# Patient Record
Sex: Male | Born: 1965 | Race: White | Hispanic: No | Marital: Married | Smoking: Never smoker
Health system: Southern US, Community
[De-identification: ages and names within clinical notes are randomized; demographics above are authoritative.]

## PROBLEM LIST (undated history)

## (undated) ENCOUNTER — Ambulatory Visit: Admission: EM | Payer: BC Managed Care – PPO | Source: Home / Self Care

## (undated) DIAGNOSIS — I10 Essential (primary) hypertension: Secondary | ICD-10-CM

## (undated) HISTORY — PX: NO PAST SURGERIES: SHX2092

---

## 2007-01-25 ENCOUNTER — Emergency Department: Payer: Self-pay | Admitting: Emergency Medicine

## 2007-01-31 ENCOUNTER — Ambulatory Visit: Payer: Self-pay

## 2020-04-16 ENCOUNTER — Emergency Department: Payer: BC Managed Care – PPO

## 2020-04-16 ENCOUNTER — Other Ambulatory Visit: Payer: Self-pay

## 2020-04-16 ENCOUNTER — Inpatient Hospital Stay
Admission: EM | Admit: 2020-04-16 | Discharge: 2020-04-18 | DRG: 177 | Disposition: A | Discharge status: Home / Self Care | Payer: BC Managed Care – PPO | Attending: Internal Medicine | Admitting: Internal Medicine

## 2020-04-16 DIAGNOSIS — Z8249 Family history of ischemic heart disease and other diseases of the circulatory system: Secondary | ICD-10-CM

## 2020-04-16 DIAGNOSIS — U071 COVID-19: Secondary | ICD-10-CM | POA: Diagnosis present

## 2020-04-16 DIAGNOSIS — Z833 Family history of diabetes mellitus: Secondary | ICD-10-CM

## 2020-04-16 DIAGNOSIS — R7301 Impaired fasting glucose: Secondary | ICD-10-CM | POA: Diagnosis not present

## 2020-04-16 DIAGNOSIS — J1282 Pneumonia due to coronavirus disease 2019: Secondary | ICD-10-CM | POA: Diagnosis present

## 2020-04-16 DIAGNOSIS — Z79899 Other long term (current) drug therapy: Secondary | ICD-10-CM

## 2020-04-16 DIAGNOSIS — I10 Essential (primary) hypertension: Secondary | ICD-10-CM | POA: Diagnosis present

## 2020-04-16 DIAGNOSIS — J189 Pneumonia, unspecified organism: Secondary | ICD-10-CM

## 2020-04-16 HISTORY — DX: Essential (primary) hypertension: I10

## 2020-04-16 LAB — CBC WITH DIFFERENTIAL/PLATELET
Abs Immature Granulocytes: 0.35 10*3/uL — ABNORMAL HIGH (ref 0.00–0.07)
Basophils Absolute: 0 10*3/uL (ref 0.0–0.1)
Basophils Relative: 0 %
Eosinophils Absolute: 0 10*3/uL (ref 0.0–0.5)
Eosinophils Relative: 0 %
HCT: 42.4 % (ref 39.0–52.0)
Hemoglobin: 14.9 g/dL (ref 13.0–17.0)
Immature Granulocytes: 5 %
Lymphocytes Relative: 14 %
Lymphs Abs: 1 10*3/uL (ref 0.7–4.0)
MCH: 31.1 pg (ref 26.0–34.0)
MCHC: 35.1 g/dL (ref 30.0–36.0)
MCV: 88.5 fL (ref 80.0–100.0)
Monocytes Absolute: 0.4 10*3/uL (ref 0.1–1.0)
Monocytes Relative: 6 %
Neutro Abs: 5.1 10*3/uL (ref 1.7–7.7)
Neutrophils Relative %: 75 %
Platelets: 179 10*3/uL (ref 150–400)
RBC: 4.79 MIL/uL (ref 4.22–5.81)
RDW: 12.8 % (ref 11.5–15.5)
WBC: 6.8 10*3/uL (ref 4.0–10.5)
nRBC: 0 % (ref 0.0–0.2)

## 2020-04-16 LAB — BASIC METABOLIC PANEL
Anion gap: 12 (ref 5–15)
BUN: 14 mg/dL (ref 6–20)
CO2: 28 mmol/L (ref 22–32)
Calcium: 8.7 mg/dL — ABNORMAL LOW (ref 8.9–10.3)
Chloride: 98 mmol/L (ref 98–111)
Creatinine, Ser: 0.97 mg/dL (ref 0.61–1.24)
GFR calc Af Amer: >60 mL/min (ref >60)
GFR calc non Af Amer: >60 mL/min (ref >60)
Glucose, Bld: 124 mg/dL — ABNORMAL HIGH (ref 70–99)
Potassium: 3.8 mmol/L (ref 3.5–5.1)
Sodium: 138 mmol/L (ref 135–145)

## 2020-04-16 LAB — HIV ANTIBODY (ROUTINE TESTING W REFLEX): HIV Screen 4th Generation wRfx: NONREACTIVE

## 2020-04-16 LAB — FERRITIN: Ferritin: 1697 ng/mL — ABNORMAL HIGH (ref 24–336)

## 2020-04-16 LAB — C-REACTIVE PROTEIN: CRP: 6 mg/dL — ABNORMAL HIGH (ref <1.0)

## 2020-04-16 LAB — HEMOGLOBIN A1C
Hgb A1c MFr Bld: 5.9 % — ABNORMAL HIGH (ref 4.8–5.6)
Mean Plasma Glucose: 122.63 mg/dL

## 2020-04-16 LAB — ABO/RH: ABO/RH(D): A POS

## 2020-04-16 LAB — PROCALCITONIN: Procalcitonin: <0.1 ng/mL

## 2020-04-16 MED ORDER — SODIUM CHLORIDE 0.9 % IV SOLN
100.0000 mg | Freq: Every day | INTRAVENOUS | Status: DC
  Administered 2020-04-17 – 2020-04-18 (×2): 100 mg via INTRAVENOUS
  Filled 2020-04-16 (×2): qty 20

## 2020-04-16 MED ORDER — AMLODIPINE BESYLATE 5 MG PO TABS
5.0000 mg | ORAL_TABLET | Freq: Every day | ORAL | Status: DC
  Administered 2020-04-17 – 2020-04-18 (×2): 5 mg via ORAL
  Filled 2020-04-16 (×2): qty 1

## 2020-04-16 MED ORDER — FLUTICASONE PROPIONATE 50 MCG/ACT NA SUSP
2.0000 | Freq: Every day | NASAL | Status: DC
  Administered 2020-04-17 – 2020-04-18 (×2): 2 via NASAL
  Filled 2020-04-16: qty 16

## 2020-04-16 MED ORDER — ONDANSETRON HCL 4 MG/2ML IJ SOLN
4.0000 mg | Freq: Four times a day (QID) | INTRAMUSCULAR | Status: DC | PRN
  Filled 2020-04-16: qty 2

## 2020-04-16 MED ORDER — ZINC SULFATE 220 (50 ZN) MG PO CAPS
220.0000 mg | ORAL_CAPSULE | Freq: Every day | ORAL | Status: DC
  Administered 2020-04-17 – 2020-04-18 (×2): 220 mg via ORAL
  Filled 2020-04-16 (×3): qty 1

## 2020-04-16 MED ORDER — OXYCODONE HCL 5 MG PO TABS
5.0000 mg | ORAL_TABLET | ORAL | Status: DC | PRN
  Administered 2020-04-17: 5 mg via ORAL
  Filled 2020-04-16: qty 1

## 2020-04-16 MED ORDER — ASCORBIC ACID 500 MG PO TABS
500.0000 mg | ORAL_TABLET | Freq: Two times a day (BID) | ORAL | Status: DC
  Administered 2020-04-16 – 2020-04-18 (×4): 500 mg via ORAL
  Filled 2020-04-16 (×3): qty 1

## 2020-04-16 MED ORDER — DEXTROMETHORPHAN POLISTIREX ER 30 MG/5ML PO SUER
30.0000 mg | Freq: Every evening | ORAL | Status: DC | PRN
  Administered 2020-04-17: 30 mg via ORAL
  Filled 2020-04-16 (×2): qty 5

## 2020-04-16 MED ORDER — ENOXAPARIN SODIUM 40 MG/0.4ML ~~LOC~~ SOLN
40.0000 mg | SUBCUTANEOUS | Status: DC
  Administered 2020-04-16 – 2020-04-18 (×3): 40 mg via SUBCUTANEOUS
  Filled 2020-04-16 (×2): qty 0.4

## 2020-04-16 MED ORDER — ACETAMINOPHEN 325 MG PO TABS
650.0000 mg | ORAL_TABLET | Freq: Four times a day (QID) | ORAL | Status: DC | PRN
  Administered 2020-04-16 – 2020-04-17 (×2): 650 mg via ORAL
  Filled 2020-04-16 (×2): qty 2

## 2020-04-16 MED ORDER — KETOROLAC TROMETHAMINE 15 MG/ML IJ SOLN
15.0000 mg | Freq: Once | INTRAMUSCULAR | Status: DC
  Filled 2020-04-16: qty 1

## 2020-04-16 MED ORDER — SODIUM CHLORIDE 0.9 % IV SOLN
200.0000 mg | Freq: Once | INTRAVENOUS | Status: AC
  Administered 2020-04-16: 200 mg via INTRAVENOUS
  Filled 2020-04-16: qty 40

## 2020-04-16 MED ORDER — DEXAMETHASONE SODIUM PHOSPHATE 10 MG/ML IJ SOLN
6.0000 mg | INTRAMUSCULAR | Status: DC
  Administered 2020-04-16 – 2020-04-17 (×2): 6 mg via INTRAVENOUS
  Filled 2020-04-16 (×2): qty 1

## 2020-04-16 NOTE — ED Triage Notes (Signed)
Pt states he tested positive for covid last Monday and is not feeling any better, cough bodyaches, congestion, SOB. Pt is in NAD, states he has been taking OTC meds and prednisone his PCP Rx.

## 2020-04-16 NOTE — ED Provider Notes (Signed)
[facility] Emergency Department Provider Note   ____________________________________________   First MD Initiated Contact with Patient 04/16/20 1259     (approximate)  I have reviewed the triage vital signs and the nursing notes.   HISTORY  Chief Complaint Covid + and Shortness of Breath   HPI Levi Hernandez is a 54 y.o. male who presents to the ER with worsening COVID symptoms and shortness of breath. Patient was seen at [facility] 8 days ago, and tested positive following 2 days of symptoms. Since that time, he has continued to run fevers that he is treating with tylenol. He had acute worsening of symptoms last night when his cough turned more productive and is a green/grey color in appearance. He states it has become more difficult to breathe in the last 24 hrs than it was prior. His breathing is more difficult with activity including standing or walking. Denies chest pain, denies hemoptysis. Endorses mild intermittent abdominal pain but feels this may be due to not eating this morning.          Past Medical History:  Diagnosis Date  . Hypertension     Patient Active Problem List   Diagnosis Date Noted  . Pneumonia due to COVID-19 virus 04/16/2020    History reviewed. No pertinent surgical history.  Prior to Admission medications   Medication Sig Start Date End Date Taking? Authorizing Provider  amLODipine (NORVASC) 5 MG tablet Take 5 mg by mouth daily.   Yes [provider]  ibuprofen (ADVIL) 400 MG tablet Take 400 mg by mouth every 6 (six) hours as needed.   Yes [provider]  losartan (COZAAR) 100 MG tablet Take 100 mg by mouth daily.   Yes [provider]  predniSONE (DELTASONE) 10 MG tablet Take 10 mg by mouth daily. 04/08/20  Yes [provider]    Allergies Patient has no known allergies.  No family history on file.  Social History Social History   Tobacco Use  . Smoking status:  Never Smoker  . Smokeless tobacco: Never Used  Substance Use Topics  . Alcohol use: Yes  . Drug use: Not Currently    Review of Systems Constitutional: No fever/chills Eyes: No visual changes. ENT: + cough, + Congestion Cardiovascular: Denies chest pain. Respiratory: + shortness of breath Gastrointestinal: + abdominal pain.  No nausea, no vomiting.  No diarrhea.  No constipation. Genitourinary: Negative for dysuria. Musculoskeletal: Negative for back pain. Skin: Negative for rash. Neurological: Negative for headaches, focal weakness or numbness.  ____________________________________________   PHYSICAL EXAM:  VITAL SIGNS: ED Triage Vitals [04/16/20 1248]  Enc Vitals Group     BP (!) 134/92     Pulse Rate 77     Resp 17     Temp 98.7 F (37.1 C)     Temp Source Oral     SpO2 95 %     Weight 185 lb (83.9 kg)     Height 5\' 8"  (1.727 m)     Head Circumference      Peak Flow      Pain Score 8     Pain Loc      Pain Edu?      Excl. in GC?    Constitutional: Alert and oriented. Ill appearing, not in acute distress. Eyes: Conjunctivae are normal. PERRL. EOMI. Head: Atraumatic. Nose: No congestion/rhinnorhea. Mouth/Throat: Mucous membranes are moist.  Oropharynx erythematous. Neck: No stridor.  Cardiovascular: Normal rate, regular rhythm. Grossly normal  heart sounds.  Good peripheral circulation. Respiratory: Increased effort to breath. Crackles in bilateral lung bases, left worse than right. Gastrointestinal: Soft and nontender. No distention. No abdominal bruits. No CVA tenderness. Musculoskeletal: No lower extremity tenderness nor edema.  No joint effusions. Neurologic:  Normal speech and language. No gross focal neurologic deficits are appreciated. No gait instability. Skin:  Skin is warm, dry and intact. No rash noted. Psychiatric: Mood and affect are normal. Speech and behavior are normal.  ____________________________________________   LABS (all labs ordered  are listed, but only abnormal results are displayed)  Labs Reviewed  CBC WITH DIFFERENTIAL/PLATELET - Abnormal; Notable for the following components:      Result Value   Abs Immature Granulocytes 0.35 (*)    All other components within normal limits  BASIC METABOLIC PANEL - Abnormal; Notable for the following components:   Glucose, Bld 124 (*)    Calcium 8.7 (*)    All other components within normal limits   ____________________________________________  EKG   ____________________________________________  RADIOLOGY  ED MD interpretation:    Official radiology report(s): DG Chest 2 View  Result Date: 04/16/2020 CLINICAL DATA:  COVID positive. EXAM: CHEST - 2 VIEW COMPARISON:  None. FINDINGS: Hypoinflated lungs. Bilateral mid to lower lung patchy opacities. No pneumothorax or pleural effusion. Cardiomediastinal silhouette within normal limits. No acute osseous abnormality. IMPRESSION: Multifocal pulmonary opacities concerning for pneumonia. Electronically Signed   By: Stana Bunting M.D.   On: 04/16/2020 13:56    ____________________________________________   PROCEDURES  Procedure(s) performed (including Critical Care):  Procedures   ____________________________________________   INITIAL IMPRESSION / ASSESSMENT AND PLAN / ED COURSE  As part of my medical decision making, I reviewed the following data within the electronic MEDICAL RECORD NUMBER Notes from prior ED visits      Levi Hernandez is a 54 yo male who presents today for worsening with COVID infection. Symptomatic x 10 days, tested positive 8 days ago. Course was consistent without progress until last 24 hours, when he seems to have worsened. O2 in triage 95%, which it was noted to be at initial eval by [facility] clinic. Vitals otherwise stable. Exam with bilateral crackles in lung bases, concerning for pneumonia. Given worsening, will obtain Chest XR, CBC, BMP.   Chest XR revealed multifocal opacities in the  bilateral lungs, concerning for pneumonia. CBC with increased immature granulocytes. BMP grossly normal . Will obtain consult of hospitalist for consideration for admission.   After discussion with Dr. Dorothea Glassman and consult with hospitalist team, patient will be admitted.  Clinical Course as of Apr 16 1545  Tue Apr 16, 2020  1409 MCHC: 35.1 [CR]    Clinical Course User Index [CR] Lucy Chris, PA     ____________________________________________   FINAL CLINICAL IMPRESSION(S) / ED DIAGNOSES  Final diagnoses:  COVID-19  Community acquired pneumonia, unspecified laterality     ED Discharge Orders    None       Note:  This document was prepared using Dragon voice recognition software and may include unintentional dictation errors.    Lucy Chris, PA 04/16/20 1547    Arnaldo Natal, MD 04/16/20 [id number]

## 2020-04-16 NOTE — ED Notes (Signed)
See triage note  Presents with body aches,cough and congestion  Was tested positive for COVID about 1 week ago

## 2020-04-16 NOTE — H&P (Addendum)
[facility]-  at [facility]   PATIENT NAME: Levi Hernandez    MR#:  [id number]  DATE OF BIRTH:  [birth year]  DATE OF ADMISSION:  04/16/2020  PRIMARY CARE PHYSICIAN: Dr Jerl Mina  REQUESTING/REFERRING PHYSICIAN: Dr Dorothea Glassman  CHIEF COMPLAINT:   Chief Complaint  Patient presents with  . Covid +  . Shortness of Breath    HISTORY OF PRESENT ILLNESS:  Levi Hernandez  is a 54 y.o. male recently diagnosed with Covid +8 days ago.  Over the last few days getting worse with regards to coughing and shortness of breath.  Bringing up dark phlegm.  He has been having fevers on and off and some chills.  Some runny nose.  Exaggerated taste.  Some joint pains.  He had worsening shortness of breath.  Does have poor appetite.  His mouth is dry.  In the ER he was found to have pneumonia on the chest x-ray.  Hospitalist services contacted for admission for Covid 19 pneumonia.  The patient has not had the COVID-19 vaccination.  PAST MEDICAL HISTORY:   Past Medical History:  Diagnosis Date  . Hypertension     PAST SURGICAL HISTORY:   Past Surgical History:  Procedure Laterality Date  . NO PAST SURGERIES      SOCIAL HISTORY:   Social History   Tobacco Use  . Smoking status: Never Smoker  . Smokeless tobacco: Never Used  Substance Use Topics  . Alcohol use: Yes    Comment: few drink on the weekneds    FAMILY HISTORY:   Family History  Problem Relation Age of Onset  . Diabetes Mother   . Hypertension Mother   . Diabetes Father   . Hypertension Father     DRUG ALLERGIES:  No Known Allergies  REVIEW OF SYSTEMS:  CONSTITUTIONAL: Positive for fever, and chills.  EYES: No blurred or double vision.  EARS, NOSE, AND THROAT: Positive for runny nose.  Exaggerated taste. RESPIRATORY: Positive for cough and shortness of breath. CARDIOVASCULAR: No chest pain.  GASTROINTESTINAL: Some nausea but no vomiting.  Some abdominal pain.  Some diarrhea. GENITOURINARY:  No dysuria.  ENDOCRINE: No polyuria HEMATOLOGY: No anemia. SKIN: No rash or lesion. MUSCULOSKELETAL: Positive for joint pain.   NEUROLOGIC: No tingling, numbness.  PSYCHIATRY: No anxiety or depression.   MEDICATIONS AT HOME:   Prior to Admission medications   Medication Sig Start Date End Date Taking? Authorizing Provider  amLODipine (NORVASC) 5 MG tablet Take 5 mg by mouth daily.   Yes [provider]  fluticasone (FLONASE) 50 MCG/ACT nasal spray Place 2 sprays into both nostrils daily.   Yes [provider]  ibuprofen (ADVIL) 400 MG tablet Take 400 mg by mouth every 6 (six) hours as needed.   Yes [provider]  losartan (COZAAR) 100 MG tablet Take 100 mg by mouth daily.   Yes [provider]  predniSONE (DELTASONE) 10 MG tablet Take 10 mg by mouth daily. 04/08/20  Yes [provider]   Medication reconciliation undergoing  VITAL SIGNS:  Blood pressure 130/88, pulse 60, temperature 98.7 F (37.1 C), temperature source Oral, resp. rate 20, height 5\' 8"  (1.727 m), weight 83.9 kg, SpO2 96 %.  PHYSICAL EXAMINATION:  GENERAL:  54 y.o.-year-old patient lying in the bed with no acute distress.  EYES: Pupils equal, round, reactive to light and accommodation. No scleral icterus. HEENT: Head atraumatic, normocephalic. Oropharynx and nasopharynx clear.  NECK:  Supple, no jugular venous distention. No thyroid enlargement, no  tenderness.  LUNGS: Normal breath sounds bilaterally, no wheezing, rales,rhonchi or crepitation. No use of accessory muscles of respiration.  CARDIOVASCULAR: S1, S2 normal. No murmurs, rubs, or gallops.  ABDOMEN: Soft, nontender, nondistended.  EXTREMITIES: No pedal edema.  NEUROLOGIC: Cranial nerves II through XII are intact. Muscle strength 5/5 in all extremities.  PSYCHIATRIC: The patient is alert and oriented x 3.  SKIN: No rash, lesion, or ulcer.   LABORATORY PANEL:   CBC Recent Labs  Lab 04/16/20 1350  WBC 6.8   HGB 14.9  HCT 42.4  PLT 179   ------------------------------------------------------------------------------------------------------------------  Chemistries  Recent Labs  Lab 04/16/20 1350  NA 138  K 3.8  CL 98  CO2 28  GLUCOSE 124*  BUN 14  CREATININE 0.97  CALCIUM 8.7*   ------------------------------------------------------------------------------------------------------------------   RADIOLOGY:  DG Chest 2 View  Result Date: 04/16/2020 CLINICAL DATA:  COVID positive. EXAM: CHEST - 2 VIEW COMPARISON:  None. FINDINGS: Hypoinflated lungs. Bilateral mid to lower lung patchy opacities. No pneumothorax or pleural effusion. Cardiomediastinal silhouette within normal limits. No acute osseous abnormality. IMPRESSION: Multifocal pulmonary opacities concerning for pneumonia. Electronically Signed   By: Stana Bunting M.D.   On: 04/16/2020 13:56    IMPRESSION AND PLAN:   1.  COVID-19 pneumonia.  Will start remdesivir and Decadron.  Send off inflammatory markers.  The patient is not hypoxic at this time.  If doing better in the next day or so can potentially do outpatient infusions to finish up the runs of severe course.  We will give vitamin C and zinc. 2.  Essential hypertension.  Continue Norvasc. 3.  Impaired fasting glucose check a hemoglobin A1c   All the records, laboratory and radiological data are reviewed and case discussed with ED provider. Management plans discussed with the patient, and he is in agreement.  Patient deferred me calling family at this time.  Patient requires inpatient status with COVID-19 pneumonia and starting the remdesivir 5-day course.  Start IV Decadron.  If feeling better in the next day or so may be able to finish up the course as outpatient.  CODE STATUS: Full code  TOTAL TIME TAKING CARE OF THIS PATIENT: 50 minutes.    Alford Highland M.D on 04/16/2020 at 4:03 PM  Between 7am to 6pm - Pager - [phone]  After 6pm call admission  pager [phone]  [facility]  CC: Primary care physician; Dr. Jerl Mina

## 2020-04-16 NOTE — Progress Notes (Signed)
Remdesivir - Pharmacy Brief Note   Patient tested positive for COVID approximately one week ago.  O:  CXR: Multifocal pulmonary opacities concerning for pneumonia. SpO2: 93% on room air   A/P:  Remdesivir 200 mg IVPB once followed by 100 mg IVPB daily x 4 days.   Laureen Ochs, PharmD 04/16/2020 3:53 PM

## 2020-04-16 NOTE — ED Notes (Signed)
O2 sats  93% at rest  Drops to 92-93% with ambulation

## 2020-04-17 LAB — COMPREHENSIVE METABOLIC PANEL
ALT: 199 U/L — ABNORMAL HIGH (ref 0–44)
AST: 224 U/L — ABNORMAL HIGH (ref 15–41)
Albumin: 3.7 g/dL (ref 3.5–5.0)
Alkaline Phosphatase: 45 U/L (ref 38–126)
Anion gap: 12 (ref 5–15)
BUN: 17 mg/dL (ref 6–20)
CO2: 26 mmol/L (ref 22–32)
Calcium: 8.7 mg/dL — ABNORMAL LOW (ref 8.9–10.3)
Chloride: 102 mmol/L (ref 98–111)
Creatinine, Ser: 0.76 mg/dL (ref 0.61–1.24)
GFR calc Af Amer: >60 mL/min (ref >60)
GFR calc non Af Amer: >60 mL/min (ref >60)
Glucose, Bld: 143 mg/dL — ABNORMAL HIGH (ref 70–99)
Potassium: 4.1 mmol/L (ref 3.5–5.1)
Sodium: 140 mmol/L (ref 135–145)
Total Bilirubin: 1.3 mg/dL — ABNORMAL HIGH (ref 0.3–1.2)
Total Protein: 7.2 g/dL (ref 6.5–8.1)

## 2020-04-17 LAB — CBC WITH DIFFERENTIAL/PLATELET
Abs Immature Granulocytes: 0.27 10*3/uL — ABNORMAL HIGH (ref 0.00–0.07)
Basophils Absolute: 0 10*3/uL (ref 0.0–0.1)
Basophils Relative: 1 %
Eosinophils Absolute: 0 10*3/uL (ref 0.0–0.5)
Eosinophils Relative: 0 %
HCT: 42 % (ref 39.0–52.0)
Hemoglobin: 15.1 g/dL (ref 13.0–17.0)
Immature Granulocytes: 7 %
Lymphocytes Relative: 27 %
Lymphs Abs: 1 10*3/uL (ref 0.7–4.0)
MCH: 31.3 pg (ref 26.0–34.0)
MCHC: 36 g/dL (ref 30.0–36.0)
MCV: 87.1 fL (ref 80.0–100.0)
Monocytes Absolute: 0.3 10*3/uL (ref 0.1–1.0)
Monocytes Relative: 8 %
Neutro Abs: 2.2 10*3/uL (ref 1.7–7.7)
Neutrophils Relative %: 57 %
Platelets: 199 10*3/uL (ref 150–400)
RBC: 4.82 MIL/uL (ref 4.22–5.81)
RDW: 12.6 % (ref 11.5–15.5)
Smear Review: NORMAL
WBC: 3.8 10*3/uL — ABNORMAL LOW (ref 4.0–10.5)
nRBC: 0 % (ref 0.0–0.2)

## 2020-04-17 LAB — C-REACTIVE PROTEIN: CRP: 5.3 mg/dL — ABNORMAL HIGH (ref <1.0)

## 2020-04-17 LAB — PHOSPHORUS: Phosphorus: 4.7 mg/dL — ABNORMAL HIGH (ref 2.5–4.6)

## 2020-04-17 LAB — MAGNESIUM: Magnesium: 2.2 mg/dL (ref 1.7–2.4)

## 2020-04-17 LAB — FERRITIN: Ferritin: 2205 ng/mL — ABNORMAL HIGH (ref 24–336)

## 2020-04-17 LAB — FIBRIN DERIVATIVES D-DIMER (ARMC ONLY): Fibrin derivatives D-dimer (ARMC): 1192.82 ng/mL (FEU) — ABNORMAL HIGH (ref 0.00–499.00)

## 2020-04-17 MED ORDER — ALBUTEROL SULFATE HFA 108 (90 BASE) MCG/ACT IN AERS
1.0000 | INHALATION_SPRAY | Freq: Four times a day (QID) | RESPIRATORY_TRACT | Status: DC
  Administered 2020-04-17 – 2020-04-18 (×4): 1 via RESPIRATORY_TRACT
  Filled 2020-04-17: qty 6.7

## 2020-04-17 MED ORDER — HYDROCOD POLST-CPM POLST ER 10-8 MG/5ML PO SUER: 5.0000 mL | Freq: Two times a day (BID) | ORAL | Status: DC | PRN

## 2020-04-17 MED ORDER — BIOTENE DRY MOUTH MT LIQD: 15.0000 mL | OROMUCOSAL | Status: DC | PRN

## 2020-04-17 MED ORDER — ADULT MULTIVITAMIN W/MINERALS CH
1.0000 | ORAL_TABLET | Freq: Every day | ORAL | Status: DC
  Administered 2020-04-18: 09:00:00 1 via ORAL
  Filled 2020-04-17: qty 1

## 2020-04-17 MED ORDER — ENSURE ENLIVE PO LIQD
237.0000 mL | Freq: Three times a day (TID) | ORAL | Status: DC
  Administered 2020-04-17 – 2020-04-18 (×3): 237 mL via ORAL

## 2020-04-17 NOTE — Progress Notes (Signed)
Initial Nutrition Assessment  DOCUMENTATION CODES:   Not applicable  INTERVENTION:   Ensure Enlive po TID, each supplement provides 350 kcal and 20 grams of protein  MVI daily   Liberalize diet   NUTRITION DIAGNOSIS:   Increased nutrient needs related to catabolic illness (COVID 19) as evidenced by increased estimated needs.  GOAL:   Patient will meet greater than or equal to 90% of their needs  MONITOR:   PO intake, Supplement acceptance, Labs, Weight trends, Skin, I & O's  REASON FOR ASSESSMENT:   Malnutrition Screening Tool    ASSESSMENT:   54 y/o male with h/o HTN who is admitted with COVID 19 PNA   Spoke with pt via phone. Pt reports decreased appetite and oral intake for 1 week pta r/t COVID 19. Pt reports that he has been eating some and that he feels hungry today. RD discussed with pt the importance of adequate nutrition needed to preserve lean muscle. Pt is willing to drink chocolate supplements. RD will add supplements and MVI to help pt meet his estimated needs. RD will also liberalize pt's diet. Per chart pt's UBW appears to be ~185-190lbs; pt appears weight stable at baseline.   Medications reviewed and include: vitamin C, dexamethasone, lovenox, zinc  Labs reviewed: K 4.1 wnl, P 4.7(H), Mg 2.2 wnl Wbc- 3.8(L) cbgs- 124, 143 x 24 hrs AIC- 5.9(L)- 8/10  NUTRITION - FOCUSED PHYSICAL EXAM: Unable to perform as pt with COVID 19  Diet Order:   Diet Order            Diet regular Room service appropriate? Yes; Fluid consistency: Thin  Diet effective now                EDUCATION NEEDS:   Education needs have been addressed  Skin:  Skin Assessment: Reviewed RN Assessment  Last BM:  8/9  Height:   Ht Readings from Last 1 Encounters:  04/16/20 5\' 8"  (1.727 m)    Weight:   Wt Readings from Last 1 Encounters:  04/16/20 83.9 kg    Ideal Body Weight:  70 kg  BMI:  Body mass index is 28.13 kg/m.  Estimated Nutritional Needs:   Kcal:   2200-2500kcal/day  Protein:  110-125g/day  Fluid:  >2.1L/day  06/16/20 MS, RD, LDN Please refer to [facility] for RD and/or RD on-call/weekend/after hours pager

## 2020-04-17 NOTE — Progress Notes (Addendum)
PROGRESS NOTE    Levi Hernandez  GHW:[id number] DOB: 1965-11-21 DOA: 04/16/2020 PCP: Patient, No Pcp Per   Brief Narrative: 54 year old recently diagnosed with Covid 19 8 days prior to admission.  Presents with worsening cough and shortness of breath.  Productive cough.  Reports on and off fevers and chills.  Dry mouth.  Evaluation in the ED chest x-ray was positive for pneumonia. She is admitted for COVID-19 pneumonia.   Assessment & Plan:   Active Problems:   Pneumonia due to COVID-19 virus   1-COVID-19 pneumonia: Patient was a started on Remdesivir and Decadron. Continue with vitamin C and zinc Albuterol inhaler.  Tussionex PRN order.   2-Hypertension: Continue with Norvasc  3-Impaired fasting glucose: Hb-A1c; 5.9 Need to follow diet    Nutrition Problem: Increased nutrient needs Etiology: catabolic illness (COVID 19)    Signs/Symptoms: estimated needs    Interventions: Ensure Enlive (each supplement provides 350kcal and 20 grams of protein), MVI, Liberalize Diet  Estimated body mass index is 28.13 kg/m as calculated from the following:   Height as of this encounter: 5\' 8"  (1.727 m).   Weight as of this encounter: 83.9 kg.   DVT prophylaxis: Lovenox Code Status: Full code Family Communication: Care discussed with patient Disposition Plan:  Status is: Inpatient  Remains inpatient appropriate because:Hemodynamically unstable   Dispo: The patient is from: Home              Anticipated d/c is to: Home              Anticipated d/c date is: 2 days              Patient currently is not medically stable to d/c.        Consultants:   None  Procedures:   None  Antimicrobials:    Subjective: He report shortness of breath especially when he has cough episodes.  He reports productive cough.  He feels his mouth is dry.  Will try Biotene mouthwash.  He was asking for his blood pressure medication.  Objective: Vitals:   04/17/20 0113 04/17/20  0753 04/17/20 1109 04/17/20 1549  BP: 117/69 120/73 130/86 (!) 113/91  Pulse: 60 64 62 78  Resp: 17 17 17 17   Temp: 98.3 F (36.8 C) 97.8 F (36.6 C) 98.5 F (36.9 C) 97.9 F (36.6 C)  TempSrc:  Oral Oral Oral  SpO2: 96% 95% 94% 91%  Weight:      Height:       No intake or output data in the 24 hours ending 04/17/20 1606 Filed Weights   04/16/20 1248  Weight: 83.9 kg    Examination:  General exam: Appears calm and comfortable  Respiratory system:  Respiratory effort normal.  Bilateral rhonchorous Cardiovascular system: S1 & S2 heard, RRR. No JVD, murmurs, rubs, gallops or clicks. No pedal edema. Gastrointestinal system: Abdomen is nondistended, soft and nontender. No organomegaly or masses felt. Normal bowel sounds heard. Central nervous system: Alert and oriented. No focal neurological deficits. Extremities: Symmetric 5 x 5 power.    Data Reviewed: I have personally reviewed following labs and imaging studies  CBC: Recent Labs  Lab 04/16/20 1350 04/17/20 0449  WBC 6.8 3.8*  NEUTROABS 5.1 2.2  HGB 14.9 15.1  HCT 42.4 42.0  MCV 88.5 87.1  PLT 179 199   Basic Metabolic Panel: Recent Labs  Lab 04/16/20 1350 04/17/20 0449  NA 138 140  K 3.8 4.1  CL 98 102  CO2 28 26  GLUCOSE 124* 143*  BUN 14 17  CREATININE 0.97 0.76  CALCIUM 8.7* 8.7*  MG  --  2.2  PHOS  --  4.7*   GFR: Estimated Creatinine Clearance: 111.4 mL/min (by C-G formula based on SCr of 0.76 mg/dL). Liver Function Tests: Recent Labs  Lab 04/17/20 0449  AST 224*  ALT 199*  ALKPHOS 45  BILITOT 1.3*  PROT 7.2  ALBUMIN 3.7   No results for input(s): LIPASE, AMYLASE in the last 168 hours. No results for input(s): AMMONIA in the last 168 hours. Coagulation Profile: No results for input(s): INR, PROTIME in the last 168 hours. Cardiac Enzymes: No results for input(s): CKTOTAL, CKMB, CKMBINDEX, TROPONINI in the last 168 hours. BNP (last 3 results) No results for input(s): PROBNP in the  last 8760 hours. HbA1C: Recent Labs    04/16/20 1614  HGBA1C 5.9*   CBG: No results for input(s): GLUCAP in the last 168 hours. Lipid Profile: No results for input(s): CHOL, HDL, LDLCALC, TRIG, CHOLHDL, LDLDIRECT in the last 72 hours. Thyroid Function Tests: No results for input(s): TSH, T4TOTAL, FREET4, T3FREE, THYROIDAB in the last 72 hours. Anemia Panel: Recent Labs    04/16/20 1350 04/17/20 0449  FERRITIN 1,697* 2,205*   Sepsis Labs: Recent Labs  Lab 04/16/20 1350  PROCALCITON <0.10    No results found for this or any previous visit (from the past 240 hour(s)).       Radiology Studies: DG Chest 2 View  Result Date: 04/16/2020 CLINICAL DATA:  COVID positive. EXAM: CHEST - 2 VIEW COMPARISON:  None. FINDINGS: Hypoinflated lungs. Bilateral mid to lower lung patchy opacities. No pneumothorax or pleural effusion. Cardiomediastinal silhouette within normal limits. No acute osseous abnormality. IMPRESSION: Multifocal pulmonary opacities concerning for pneumonia. Electronically Signed   By: Stana Bunting M.D.   On: 04/16/2020 13:56        Scheduled Meds: . amLODipine  5 mg Oral Daily  . vitamin C  500 mg Oral BID  . dexamethasone (DECADRON) injection  6 mg Intravenous Q24H  . enoxaparin (LOVENOX) injection  40 mg Subcutaneous Q24H  . feeding supplement (ENSURE ENLIVE)  237 mL Oral TID BM  . fluticasone  2 spray Each Nare Daily  . ketorolac  15 mg Intravenous Once  . [START ON 04/18/2020] multivitamin with minerals  1 tablet Oral Daily  . zinc sulfate  220 mg Oral Daily   Continuous Infusions: . remdesivir 100 mg in NS 100 mL 100 mg (04/17/20 1036)     LOS: 1 day    Time spent: 35 minutes.     Alba Cory, MD [facility]   If 7PM-7AM, please contact night-coverage [email or web address]  04/17/2020, 4:06 PM

## 2020-04-18 LAB — COMPREHENSIVE METABOLIC PANEL
ALT: 222 U/L — ABNORMAL HIGH (ref 0–44)
AST: 171 U/L — ABNORMAL HIGH (ref 15–41)
Albumin: 3.7 g/dL (ref 3.5–5.0)
Alkaline Phosphatase: 46 U/L (ref 38–126)
Anion gap: 10 (ref 5–15)
BUN: 33 mg/dL — ABNORMAL HIGH (ref 6–20)
CO2: 27 mmol/L (ref 22–32)
Calcium: 9 mg/dL (ref 8.9–10.3)
Chloride: 100 mmol/L (ref 98–111)
Creatinine, Ser: 0.91 mg/dL (ref 0.61–1.24)
GFR calc Af Amer: >60 mL/min (ref >60)
GFR calc non Af Amer: >60 mL/min (ref >60)
Glucose, Bld: 163 mg/dL — ABNORMAL HIGH (ref 70–99)
Potassium: 4.3 mmol/L (ref 3.5–5.1)
Sodium: 137 mmol/L (ref 135–145)
Total Bilirubin: 1.1 mg/dL (ref 0.3–1.2)
Total Protein: 7.1 g/dL (ref 6.5–8.1)

## 2020-04-18 LAB — CBC WITH DIFFERENTIAL/PLATELET
Abs Immature Granulocytes: 0.31 10*3/uL — ABNORMAL HIGH (ref 0.00–0.07)
Basophils Absolute: 0 10*3/uL (ref 0.0–0.1)
Basophils Relative: 0 %
Eosinophils Absolute: 0 10*3/uL (ref 0.0–0.5)
Eosinophils Relative: 0 %
HCT: 39.6 % (ref 39.0–52.0)
Hemoglobin: 14.1 g/dL (ref 13.0–17.0)
Immature Granulocytes: 4 %
Lymphocytes Relative: 14 %
Lymphs Abs: 1.3 10*3/uL (ref 0.7–4.0)
MCH: 31.5 pg (ref 26.0–34.0)
MCHC: 35.6 g/dL (ref 30.0–36.0)
MCV: 88.4 fL (ref 80.0–100.0)
Monocytes Absolute: 0.5 10*3/uL (ref 0.1–1.0)
Monocytes Relative: 6 %
Neutro Abs: 6.8 10*3/uL (ref 1.7–7.7)
Neutrophils Relative %: 76 %
Platelets: 260 10*3/uL (ref 150–400)
RBC: 4.48 MIL/uL (ref 4.22–5.81)
RDW: 12.8 % (ref 11.5–15.5)
Smear Review: NORMAL
WBC: 8.9 10*3/uL (ref 4.0–10.5)
nRBC: 0 % (ref 0.0–0.2)

## 2020-04-18 LAB — PHOSPHORUS: Phosphorus: 4.3 mg/dL (ref 2.5–4.6)

## 2020-04-18 LAB — C-REACTIVE PROTEIN: CRP: 2.4 mg/dL — ABNORMAL HIGH (ref <1.0)

## 2020-04-18 LAB — MAGNESIUM: Magnesium: 2.2 mg/dL (ref 1.7–2.4)

## 2020-04-18 LAB — FERRITIN: Ferritin: 1578 ng/mL — ABNORMAL HIGH (ref 24–336)

## 2020-04-18 LAB — FIBRIN DERIVATIVES D-DIMER (ARMC ONLY): Fibrin derivatives D-dimer (ARMC): 854.02 ng/mL (FEU) — ABNORMAL HIGH (ref 0.00–499.00)

## 2020-04-18 MED ORDER — ALBUTEROL SULFATE HFA 108 (90 BASE) MCG/ACT IN AERS: 1.0000 | INHALATION_SPRAY | Freq: Four times a day (QID) | RESPIRATORY_TRACT | 0 refills | Status: AC

## 2020-04-18 MED ORDER — ACETAMINOPHEN 325 MG PO TABS: 650.0000 mg | ORAL_TABLET | Freq: Four times a day (QID) | ORAL | 0 refills | Status: AC | PRN

## 2020-04-18 MED ORDER — ASCORBIC ACID 500 MG PO TABS: 500.0000 mg | ORAL_TABLET | Freq: Two times a day (BID) | ORAL | 0 refills | Status: AC

## 2020-04-18 MED ORDER — ADULT MULTIVITAMIN W/MINERALS CH: 1.0000 | ORAL_TABLET | Freq: Every day | ORAL | 0 refills | Status: AC

## 2020-04-18 MED ORDER — ZINC SULFATE 220 (50 ZN) MG PO CAPS: 220.0000 mg | ORAL_CAPSULE | Freq: Every day | ORAL | 0 refills | Status: AC

## 2020-04-18 MED ORDER — DM-GUAIFENESIN ER 30-600 MG PO TB12
1.0000 | ORAL_TABLET | Freq: Two times a day (BID) | ORAL | 0 refills | Status: AC | PRN
Start: 2020-04-18 — End: ?

## 2020-04-18 MED ORDER — DEXAMETHASONE 6 MG PO TABS
6.0000 mg | ORAL_TABLET | Freq: Every day | ORAL | 0 refills | Status: AC
Start: 2020-04-18 — End: ?

## 2020-04-18 NOTE — Discharge Instructions (Signed)
Patient scheduled for outpatient Remdesivir infusions at 10am on Friday 8/13 and Saturday 8/14 at [facility]. Please inform the patient to park at [address] [place], [place], as staff will be escorting the patient through the east entrance of the hospital.    There is a wave flag banner located near the entrance on [address]. Turn into this entrance and immediately turn left and park in 1 of the 5 designated Covid Infusion Parking spots. There is a phone number on the sign, please call and let the staff know what spot you are in and we will come out and get you. For questions call [phone].  Thanks.   * If patient is getting dropped off, patient may be dropped off at the Main Entrance of [facility]. Please stay in your car and call [phone], staff will meet you at your car and escort you through the hospital to the clinic.

## 2020-04-18 NOTE — Progress Notes (Addendum)
Patient scheduled for outpatient Remdesivir infusions at 10am on Friday 8/13 and Saturday 8/14 at [facility]. Please inform the patient to park at [address], [place], as staff will be escorting the patient through the east entrance of the hospital.    There is a wave flag banner located near the entrance on [address] Turn into this entrance and immediately turn left and park in 1 of the 5 designated Covid Infusion Parking spots. There is a phone number on the sign, please call and let the staff know what spot you are in and we will come out and get you. For questions call [phone].  Thanks.   * If patient is getting dropped off, patient may be dropped off at the Main Entrance of [place] [facility]. Please stay in your car and call [phone], staff will meet you at your car and escort you through the hospital to the clinic.  

## 2020-04-18 NOTE — Discharge Summary (Signed)
Physician Discharge Summary  Levi Hernandez TDD:[id number] DOB: 1966/08/26 DOA: 04/16/2020  PCP: Patient, No Pcp Per  Admit date: 04/16/2020 Discharge date: 04/18/2020  Admitted From: Home  Disposition:  Home   Recommendations for Outpatient Follow-up:  1. Follow up with PCP in 1-2 weeks 2. Please obtain BMP/CBC in one week 3. Patient needs to complete 2 doses of Remdesivir at infusion clinic [facility].  4. Needs to complete 5 days of dexamethasone.  5.  Home Health: none  Discharge Condition: Stable.  CODE STATUS: Full code Diet recommendation: Heart Healthy   Brief/Interim Summary: 54 year old recently diagnosed with Covid 19 8 days prior to admission.  Presents with worsening cough and shortness of breath.  Productive cough.  Reports on and off fevers and chills.  Dry mouth.  Evaluation in the ED chest x-ray was positive for pneumonia. She is admitted for COVID-19 pneumonia.   1-COVID-19 pneumonia: Patient was a started on Remdesivir and Decadron. Today is day 3 Remdesivir. His Oxygen saturation has remain above 95 % Room air. Cough improved. Plan to complete Remdesivir at infusion clinic. He needs 2 more doses. Appointment has been arrange.  Continue with vitamin C and zinc Albuterol inhaler.  Tussionex PRN order.  He will be discharge on 5 days of Dexamethasone.   2-Hypertension: Continue with Norvasc  3-Impaired fasting glucose: Hb-A1c; 5.9 Need to follow diet   Discharge Diagnoses:  Active Problems:   Pneumonia due to COVID-19 virus    Discharge Instructions  Discharge Instructions    Diet - low sodium heart healthy   Complete by: As directed    Increase activity slowly   Complete by: As directed      Allergies as of 04/18/2020   No Known Allergies     Medication List    STOP taking these medications   ibuprofen 400 MG tablet Commonly known as: ADVIL   predniSONE 10 MG tablet Commonly known as: DELTASONE   SAW PALMETTO EXTRACT  PO     TAKE these medications   acetaminophen 325 MG tablet Commonly known as: TYLENOL Take 2 tablets (650 mg total) by mouth every 6 (six) hours as needed for mild pain, fever or headache.   albuterol 108 (90 Base) MCG/ACT inhaler Commonly known as: VENTOLIN HFA Inhale 1 puff into the lungs every 6 (six) hours.   amLODipine 5 MG tablet Commonly known as: NORVASC Take 5 mg by mouth daily.   ascorbic acid 500 MG tablet Commonly known as: VITAMIN C Take 1 tablet (500 mg total) by mouth 2 (two) times daily.   dexamethasone 6 MG tablet Commonly known as: DECADRON Take 1 tablet (6 mg total) by mouth daily.   dextromethorphan-guaiFENesin 30-600 MG 12hr tablet Commonly known as: MUCINEX DM Take 1 tablet by mouth 2 (two) times daily as needed for cough.   fluticasone 50 MCG/ACT nasal spray Commonly known as: FLONASE Place 2 sprays into both nostrils daily.   multivitamin with minerals Tabs tablet Take 1 tablet by mouth daily. Start taking on: April 19, 2020   zinc sulfate 220 (50 Zn) MG capsule Take 1 capsule (220 mg total) by mouth daily. Start taking on: April 19, 2020       No Known Allergies  Consultations:  None   Procedures/Studies: DG Chest 2 View  Result Date: 04/16/2020 CLINICAL DATA:  COVID positive. EXAM: CHEST - 2 VIEW COMPARISON:  None. FINDINGS: Hypoinflated lungs. Bilateral mid to lower lung patchy opacities. No pneumothorax or pleural effusion. Cardiomediastinal silhouette within  normal limits. No acute osseous abnormality. IMPRESSION: Multifocal pulmonary opacities concerning for pneumonia. Electronically Signed   By: Stana Bunting M.D.   On: 04/16/2020 13:56     Subjective: He is feeling better, he appears less weak. He feels comfortable going home.   Discharge Exam: Vitals:   04/18/20 0920 04/18/20 1212  BP: 114/79 113/80  Pulse: 72 68  Resp: 16 18  Temp: (!) 96.8 F (36 C) 98.2 F (36.8 C)  SpO2: 96% 95%     General: Pt is  alert, awake, not in acute distress Cardiovascular: RRR, S1/S2 +, no rubs, no gallops Respiratory: CTA bilaterally, no wheezing, no rhonchi Abdominal: Soft, NT, ND, bowel sounds + Extremities: no edema, no cyanosis    The results of significant diagnostics from this hospitalization (including imaging, microbiology, ancillary and laboratory) are listed below for reference.     Microbiology: No results found for this or any previous visit (from the past 240 hour(s)).   Labs: BNP (last 3 results) No results for input(s): BNP in the last 8760 hours. Basic Metabolic Panel: Recent Labs  Lab 04/16/20 1350 04/17/20 0449 04/18/20 0345  NA 138 140 137  K 3.8 4.1 4.3  CL 98 102 100  CO2 28 26 27   GLUCOSE 124* 143* 163*  BUN 14 17 33*  CREATININE 0.97 0.76 0.91  CALCIUM 8.7* 8.7* 9.0  MG  --  2.2 2.2  PHOS  --  4.7* 4.3   Liver Function Tests: Recent Labs  Lab 04/17/20 0449 04/18/20 0345  AST 224* 171*  ALT 199* 222*  ALKPHOS 45 46  BILITOT 1.3* 1.1  PROT 7.2 7.1  ALBUMIN 3.7 3.7   No results for input(s): LIPASE, AMYLASE in the last 168 hours. No results for input(s): AMMONIA in the last 168 hours. CBC: Recent Labs  Lab 04/16/20 1350 04/17/20 0449 04/18/20 0345  WBC 6.8 3.8* 8.9  NEUTROABS 5.1 2.2 6.8  HGB 14.9 15.1 14.1  HCT 42.4 42.0 39.6  MCV 88.5 87.1 88.4  PLT 179 199 260   Cardiac Enzymes: No results for input(s): CKTOTAL, CKMB, CKMBINDEX, TROPONINI in the last 168 hours. BNP: Invalid input(s): POCBNP CBG: No results for input(s): GLUCAP in the last 168 hours. D-Dimer No results for input(s): DDIMER in the last 72 hours. Hgb A1c Recent Labs    04/16/20 1614  HGBA1C 5.9*   Lipid Profile No results for input(s): CHOL, HDL, LDLCALC, TRIG, CHOLHDL, LDLDIRECT in the last 72 hours. Thyroid function studies No results for input(s): TSH, T4TOTAL, T3FREE, THYROIDAB in the last 72 hours.  Invalid input(s): FREET3 Anemia work up Recent Labs     04/17/20 0449 04/18/20 0345  FERRITIN 2,205* 1,578*   Urinalysis No results found for: COLORURINE, APPEARANCEUR, LABSPEC, PHURINE, GLUCOSEU, HGBUR, BILIRUBINUR, KETONESUR, PROTEINUR, UROBILINOGEN, NITRITE, LEUKOCYTESUR Sepsis Labs Invalid input(s): PROCALCITONIN,  WBC,  LACTICIDVEN Microbiology No results found for this or any previous visit (from the past 240 hour(s)).   Time coordinating discharge: 40 minutes  SIGNED:   11-03-1969, MD  [facility]

## 2020-04-19 ENCOUNTER — Ambulatory Visit (HOSPITAL_COMMUNITY)
Admit: 2020-04-19 | Discharge: 2020-04-19 | Disposition: A | Discharge status: Home / Self Care | Payer: BC Managed Care – PPO | Attending: Pulmonary Disease | Admitting: Pulmonary Disease

## 2020-04-19 DIAGNOSIS — J1282 Pneumonia due to coronavirus disease 2019: Secondary | ICD-10-CM | POA: Insufficient documentation

## 2020-04-19 DIAGNOSIS — U071 COVID-19: Secondary | ICD-10-CM | POA: Insufficient documentation

## 2020-04-19 MED ORDER — ALBUTEROL SULFATE HFA 108 (90 BASE) MCG/ACT IN AERS: 2.0000 | INHALATION_SPRAY | Freq: Once | RESPIRATORY_TRACT | Status: DC | PRN

## 2020-04-19 MED ORDER — DIPHENHYDRAMINE HCL 50 MG/ML IJ SOLN: 50.0000 mg | Freq: Once | INTRAMUSCULAR | Status: DC | PRN

## 2020-04-19 MED ORDER — SODIUM CHLORIDE 0.9 % IV SOLN
100.0000 mg | Freq: Once | INTRAVENOUS | Status: AC
  Administered 2020-04-19: 100 mg via INTRAVENOUS
  Filled 2020-04-19: qty 20

## 2020-04-19 MED ORDER — SODIUM CHLORIDE 0.9 % IV SOLN: INTRAVENOUS | Status: DC | PRN

## 2020-04-19 MED ORDER — METHYLPREDNISOLONE SODIUM SUCC 125 MG IJ SOLR: 125.0000 mg | Freq: Once | INTRAMUSCULAR | Status: DC | PRN

## 2020-04-19 MED ORDER — FAMOTIDINE IN NACL 20-0.9 MG/50ML-% IV SOLN: 20.0000 mg | Freq: Once | INTRAVENOUS | Status: DC | PRN

## 2020-04-19 MED ORDER — EPINEPHRINE 0.3 MG/0.3ML IJ SOAJ: 0.3000 mg | Freq: Once | INTRAMUSCULAR | Status: DC | PRN

## 2020-04-19 NOTE — Progress Notes (Signed)
  Diagnosis: COVID-19  Physician: Dr. Wright  Procedure: [facility] Infusion Clinic Med: remdesivir infusion - Provided patient with remdesivir fact sheet for patients, parents and caregivers prior to infusion.  Complications: No immediate complications noted.  Discharge: Discharged home   Levi Hernandez M Levi Hernandez 04/19/2020  

## 2020-04-19 NOTE — Discharge Instructions (Signed)
10 Things You Can Do to Manage Your COVID-19 Symptoms at Home If you have possible or confirmed COVID-19: 1. Stay home from work and school. And stay away from other public places. If you must go out, avoid using any kind of public transportation, ridesharing, or taxis. 2. Monitor your symptoms carefully. If your symptoms get worse, call your healthcare provider immediately. 3. Get rest and stay hydrated. 4. If you have a medical appointment, call the healthcare provider ahead of time and tell them that you have or may have COVID-19. 5. For medical emergencies, call 911 and notify the dispatch personnel that you have or may have COVID-19. 6. Cover your cough and sneezes with a tissue or use the inside of your elbow. 7. Wash your hands often with soap and water for at least 20 seconds or clean your hands with an alcohol-based hand sanitizer that contains at least 60% alcohol. 8. As much as possible, stay in a specific room and away from other people in your home. Also, you should use a separate bathroom, if available. If you need to be around other people in or outside of the home, wear a mask. 9. Avoid sharing personal items with other people in your household, like dishes, towels, and bedding. 10. Clean all surfaces that are touched often, like counters, tabletops, and doorknobs. Use household cleaning sprays or wipes according to the label instructions. cdc.gov/coronavirus 03/08/2019 This information is not intended to replace advice given to you by your health care provider. Make sure you discuss any questions you have with your health care provider. Document Revised: 08/10/2019 Document Reviewed: 08/10/2019 Elsevier Patient Education  2020 [employer]  

## 2020-04-20 ENCOUNTER — Ambulatory Visit (HOSPITAL_COMMUNITY)
Admit: 2020-04-20 | Discharge: 2020-04-20 | Disposition: A | Discharge status: Home / Self Care | Payer: BC Managed Care – PPO | Attending: Pulmonary Disease | Admitting: Pulmonary Disease

## 2020-04-20 DIAGNOSIS — J1282 Pneumonia due to coronavirus disease 2019: Secondary | ICD-10-CM | POA: Diagnosis not present

## 2020-04-20 DIAGNOSIS — U071 COVID-19: Secondary | ICD-10-CM | POA: Insufficient documentation

## 2020-04-20 MED ORDER — SODIUM CHLORIDE 0.9 % IV SOLN
100.0000 mg | Freq: Once | INTRAVENOUS | Status: AC
  Administered 2020-04-20: 100 mg via INTRAVENOUS
  Filled 2020-04-20: qty 20

## 2020-04-20 MED ORDER — FAMOTIDINE IN NACL 20-0.9 MG/50ML-% IV SOLN: 20.0000 mg | Freq: Once | INTRAVENOUS | Status: DC | PRN

## 2020-04-20 MED ORDER — ALBUTEROL SULFATE HFA 108 (90 BASE) MCG/ACT IN AERS: 2.0000 | INHALATION_SPRAY | Freq: Once | RESPIRATORY_TRACT | Status: DC | PRN

## 2020-04-20 MED ORDER — EPINEPHRINE 0.3 MG/0.3ML IJ SOAJ: 0.3000 mg | Freq: Once | INTRAMUSCULAR | Status: DC | PRN

## 2020-04-20 MED ORDER — SODIUM CHLORIDE 0.9 % IV SOLN: INTRAVENOUS | Status: DC | PRN

## 2020-04-20 MED ORDER — METHYLPREDNISOLONE SODIUM SUCC 125 MG IJ SOLR: 125.0000 mg | Freq: Once | INTRAMUSCULAR | Status: DC | PRN

## 2020-04-20 MED ORDER — DIPHENHYDRAMINE HCL 50 MG/ML IJ SOLN: 50.0000 mg | Freq: Once | INTRAMUSCULAR | Status: DC | PRN

## 2020-04-20 NOTE — Progress Notes (Signed)
  Diagnosis: COVID-19  Physician: Dr. Patrick Wright  Procedure: [facility] Infusion Clinic Med: remdesivir infusion - Provided patient with remdesivir fact sheet for patients, parents and caregivers prior to infusion.  Complications: No immediate complications noted.  Discharge: Discharged home   Levi Hernandez 04/20/2020   

## 2020-04-21 ENCOUNTER — Ambulatory Visit (HOSPITAL_COMMUNITY): Payer: BC Managed Care – PPO

## 2021-03-13 IMAGING — CR DG CHEST 2V
1 series · 2 of 2 positions shown · non-contrast
Comparison: None.

CLINICAL DATA: COVID positive.

EXAM:
CHEST - 2 VIEW

[Series 1: w chest pa · 0.14mm/px · 2 of 2 slices shown]
[im 1/2]
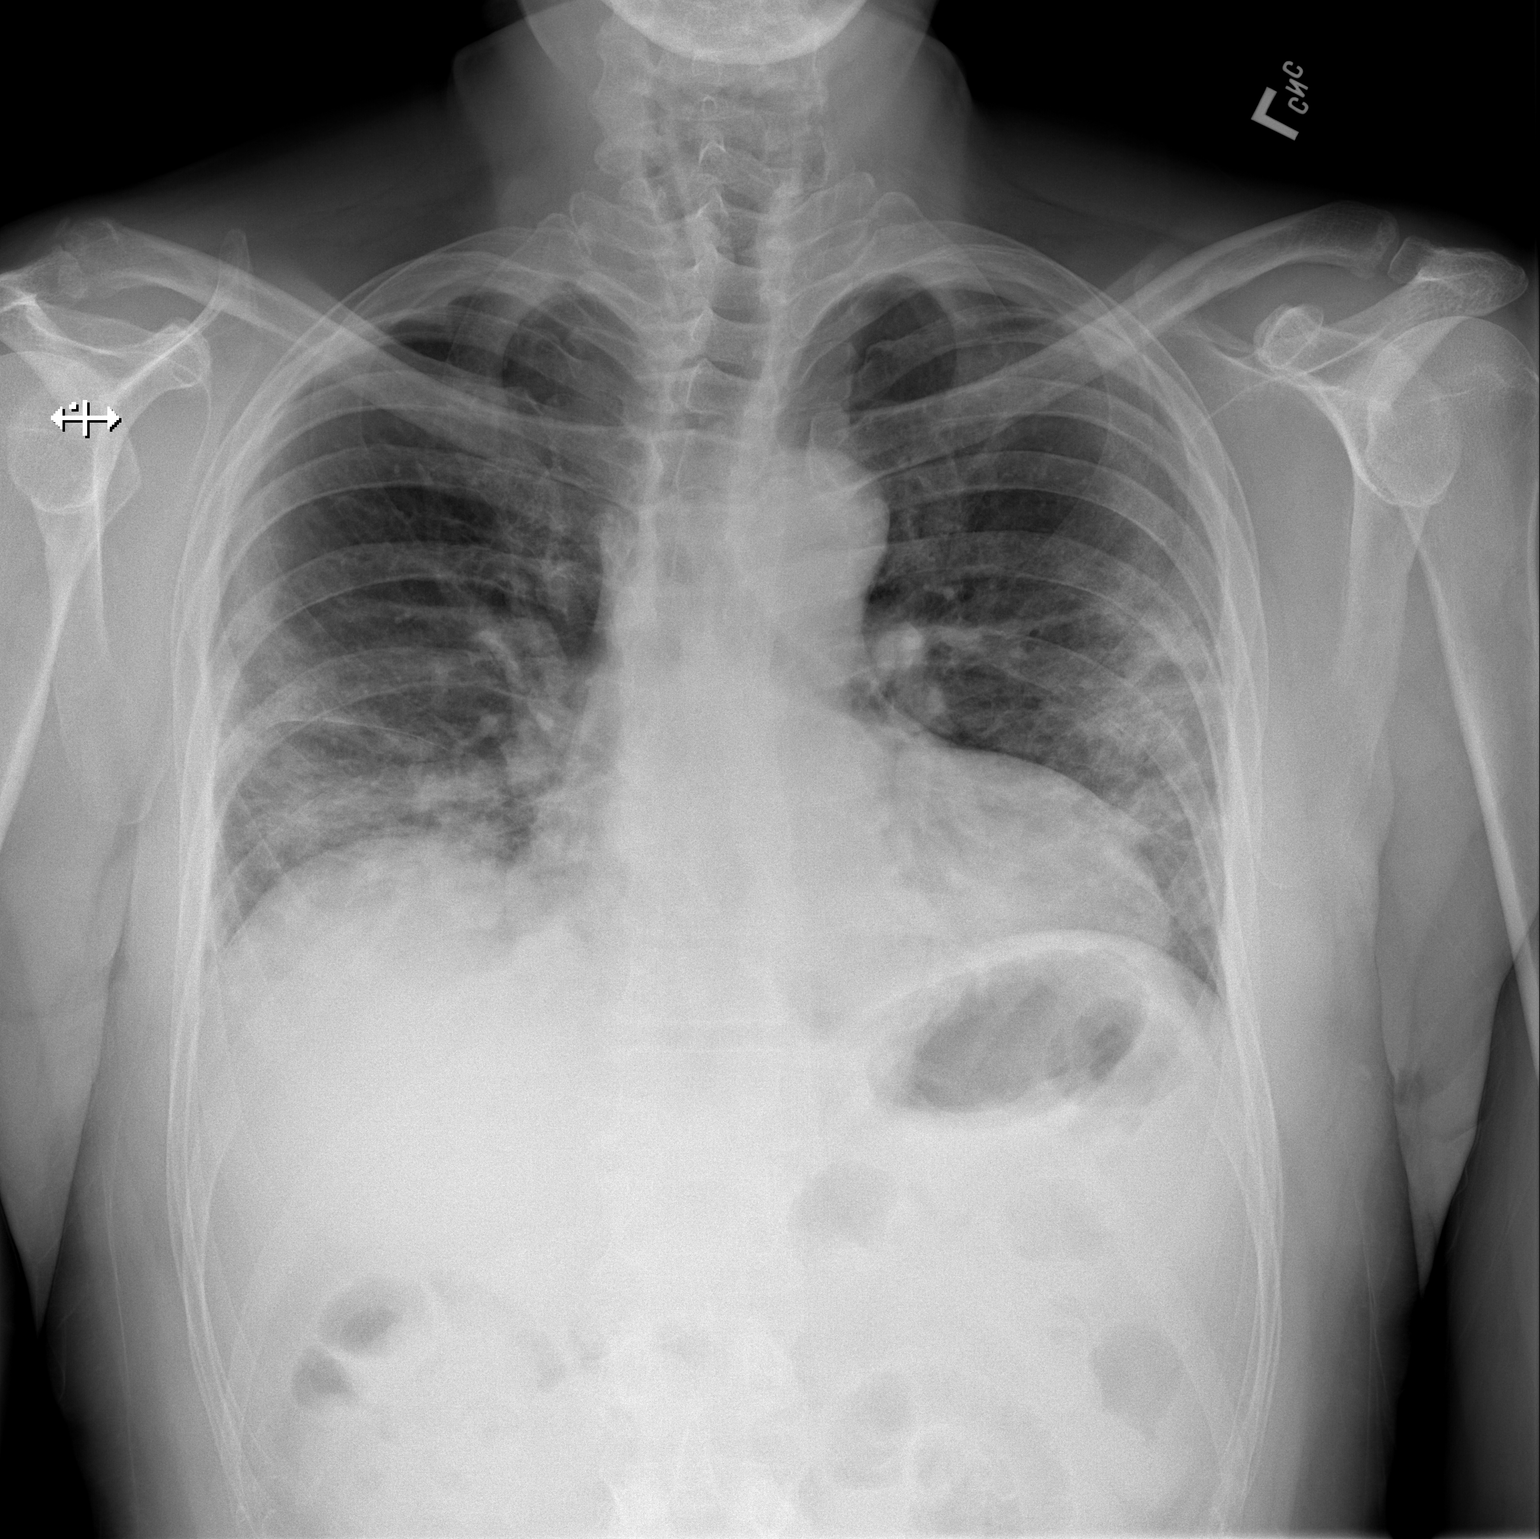
[im 2/2]
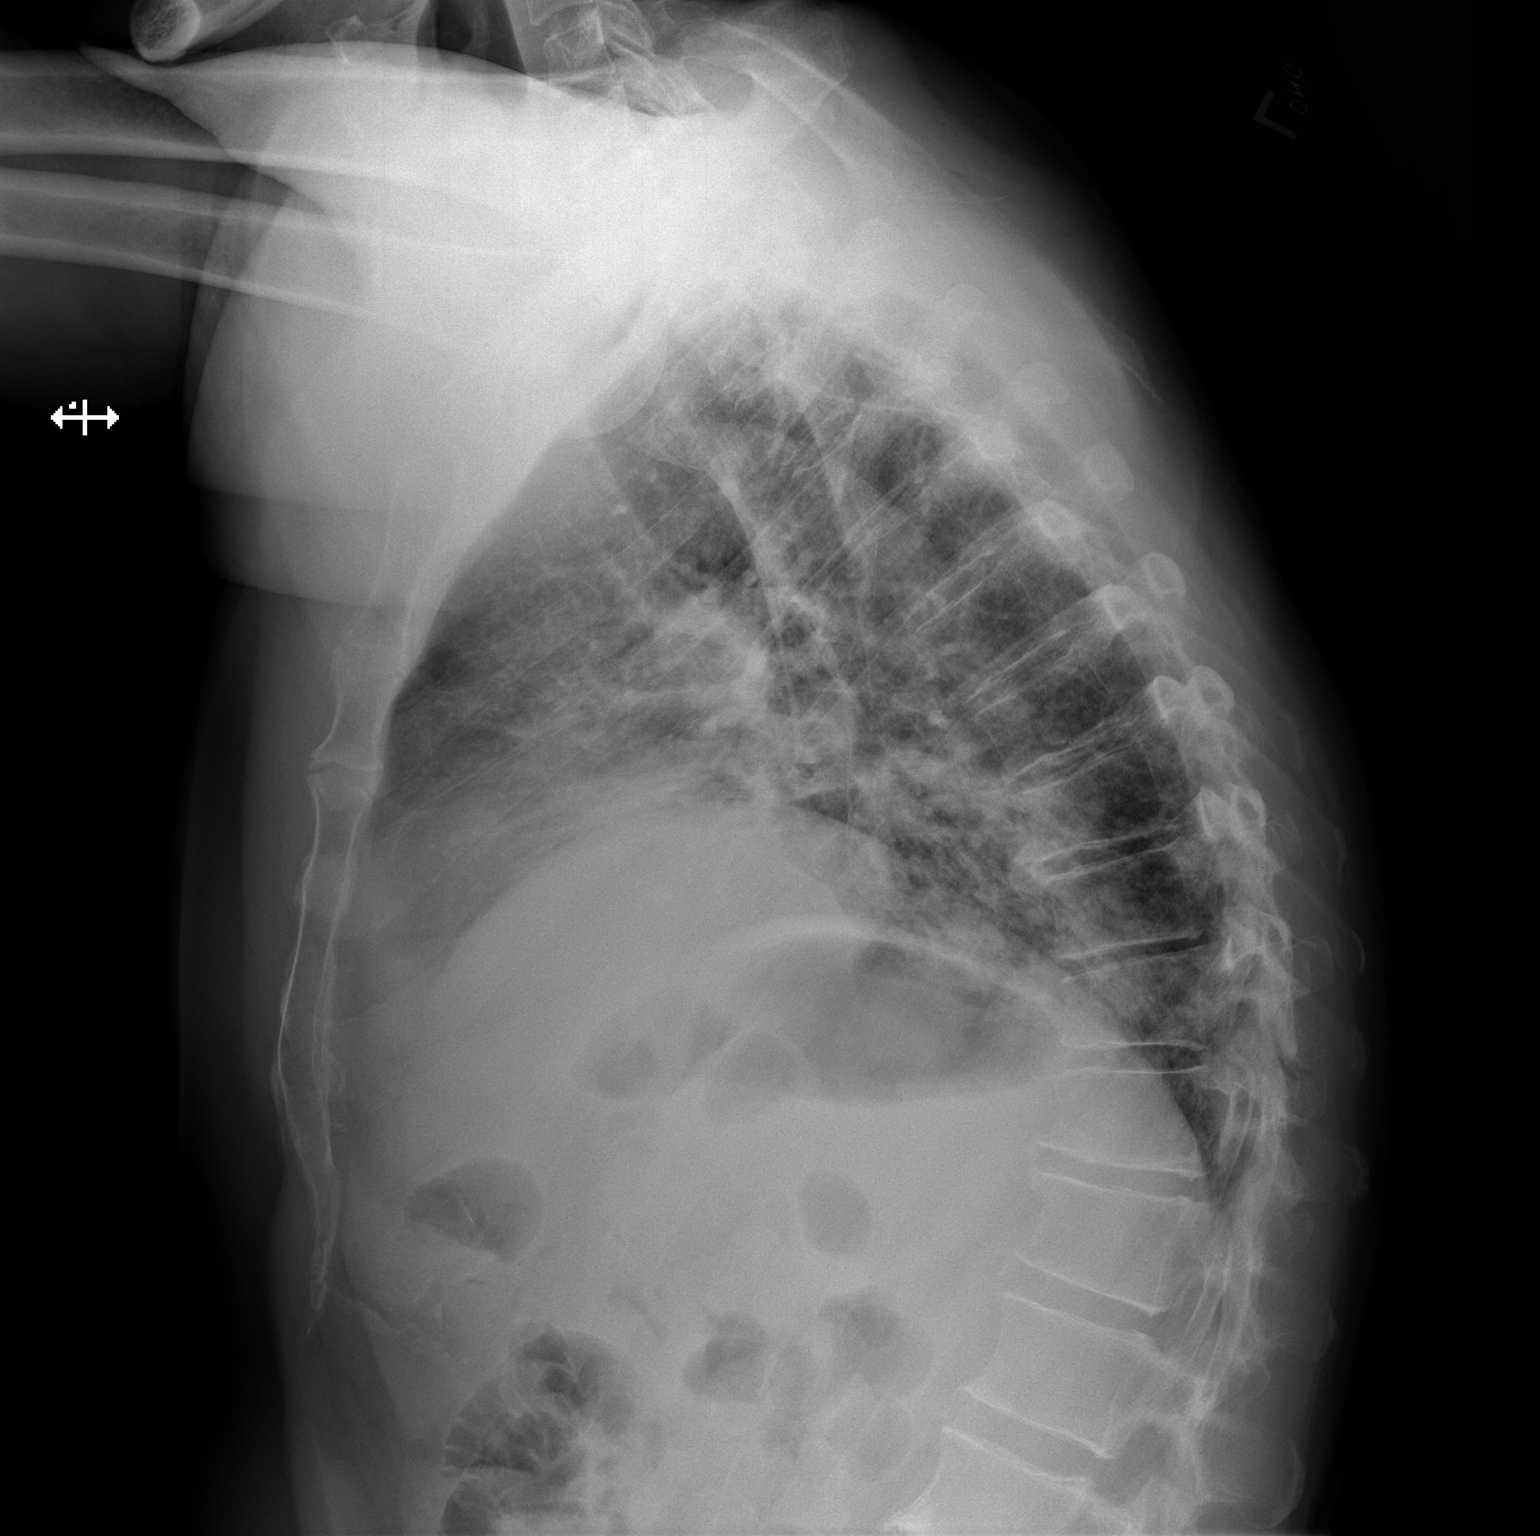

[2 of 2 positions shown; findings below may reference images not displayed]

FINDINGS: Hypoinflated lungs. Bilateral mid to lower lung patchy opacities. No
pneumothorax or pleural effusion. Cardiomediastinal silhouette
within normal limits. No acute osseous abnormality.
IMPRESSION: Multifocal pulmonary opacities concerning for pneumonia.

## 2023-05-02 ENCOUNTER — Ambulatory Visit (INDEPENDENT_AMBULATORY_CARE_PROVIDER_SITE_OTHER): Payer: BC Managed Care – PPO

## 2023-05-02 ENCOUNTER — Ambulatory Visit
Admission: EM | Admit: 2023-05-02 | Discharge: 2023-05-02 | Disposition: A | Discharge status: Home / Self Care | Payer: No Typology Code available for payment source | Attending: Emergency Medicine | Admitting: Emergency Medicine

## 2023-05-02 DIAGNOSIS — I10 Essential (primary) hypertension: Secondary | ICD-10-CM

## 2023-05-02 DIAGNOSIS — M79644 Pain in right finger(s): Secondary | ICD-10-CM

## 2023-05-02 DIAGNOSIS — S62630B Displaced fracture of distal phalanx of right index finger, initial encounter for open fracture: Secondary | ICD-10-CM

## 2023-05-02 DIAGNOSIS — S61309A Unspecified open wound of unspecified finger with damage to nail, initial encounter: Secondary | ICD-10-CM

## 2023-05-02 DIAGNOSIS — S62660B Nondisplaced fracture of distal phalanx of right index finger, initial encounter for open fracture: Secondary | ICD-10-CM

## 2023-05-02 DIAGNOSIS — Z23 Encounter for immunization: Secondary | ICD-10-CM | POA: Diagnosis not present

## 2023-05-02 MED ORDER — CEPHALEXIN 500 MG PO CAPS: 500.0000 mg | ORAL_CAPSULE | Freq: Four times a day (QID) | ORAL | 0 refills | Status: AC

## 2023-05-02 MED ORDER — TETANUS-DIPHTH-ACELL PERTUSSIS 5-2.5-18.5 LF-MCG/0.5 IM SUSY
0.5000 mL | PREFILLED_SYRINGE | Freq: Once | INTRAMUSCULAR | Status: AC
  Administered 2023-05-02: 0.5 mL via INTRAMUSCULAR

## 2023-05-02 NOTE — ED Triage Notes (Signed)
Patient to Urgent Care with complaints of right sided, index finger pain following an injury that occurred today.  Reports that his finger was caught on a piece of machinery and he jerked his finger out.    Unknown of last TDAP.

## 2023-05-02 NOTE — Discharge Instructions (Addendum)
Take the antibiotic as directed.  Keep your wound clean and dry.  Wash it gently twice a day with soap and water.  Apply an antibiotic cream twice a day.  Wear the finger splint.   Follow up with an orthopedic hand specialist.   Follow up right away if you see signs of infection, such as increased pain, redness, pus-like drainage, warmth, fever, chills, or other concerning symptoms.    Your blood pressure is elevated today at 160/91.  Please have this rechecked by your primary care provider in 2-4 weeks.

## 2023-05-02 NOTE — ED Provider Notes (Signed)
Levi Hernandez    CSN: [id number] Arrival date & time: 05/02/23  1478      History   Chief Complaint Chief Complaint  Patient presents with   Finger Injury    HPI Levi Hernandez is a 57 y.o. male.  Patient presents with pain, bruising, nail avulsion of his right index finger after he got it caught in a machine at work this morning.  He jerked his finger out of the machine when it got caught and pulled the nail off.  Treated with bandage.  Bleeding controlled.  Last tetanus unknown.  He denies numbness, weakness, paresthesias, fever, or other symptoms.  His medical history includes hypertension.  The history is provided by the patient and medical records.    Past Medical History:  Diagnosis Date   Hypertension     Patient Active Problem List   Diagnosis Date Noted   Pneumonia due to COVID-19 virus 04/16/2020   Essential hypertension    Impaired fasting glucose     Past Surgical History:  Procedure Laterality Date   NO PAST SURGERIES         Home Medications    Prior to Admission medications   Medication Sig Start Date End Date Taking? Authorizing Provider  cephALEXin (KEFLEX) 500 MG capsule Take 1 capsule (500 mg total) by mouth 4 (four) times daily. 05/02/23  Yes Mickie Bail, NP  acetaminophen (TYLENOL) 325 MG tablet Take 2 tablets (650 mg total) by mouth every 6 (six) hours as needed for mild pain, fever or headache. 04/18/20   Regalado, Belkys A, MD  albuterol (VENTOLIN HFA) 108 (90 Base) MCG/ACT inhaler Inhale 1 puff into the lungs every 6 (six) hours. 04/18/20   Regalado, Belkys A, MD  amLODipine (NORVASC) 5 MG tablet Take 5 mg by mouth daily.    [provider]  ascorbic acid (VITAMIN C) 500 MG tablet Take 1 tablet (500 mg total) by mouth 2 (two) times daily. 04/18/20   Regalado, Belkys A, MD  dexamethasone (DECADRON) 6 MG tablet Take 1 tablet (6 mg total) by mouth daily. 04/18/20   Regalado, Belkys A, MD  dextromethorphan-guaiFENesin (MUCINEX  DM) 30-600 MG 12hr tablet Take 1 tablet by mouth 2 (two) times daily as needed for cough. 04/18/20   Regalado, Belkys A, MD  fluticasone (FLONASE) 50 MCG/ACT nasal spray Place 2 sprays into both nostrils daily.    [provider]  Multiple Vitamin (MULTIVITAMIN WITH MINERALS) TABS tablet Take 1 tablet by mouth daily. 04/19/20   Regalado, Belkys A, MD  zinc sulfate 220 (50 Zn) MG capsule Take 1 capsule (220 mg total) by mouth daily. 04/19/20   Regalado, Prentiss Bells, MD    Family History Family History  Problem Relation Age of Onset   Diabetes Mother    Hypertension Mother    Diabetes Father    Hypertension Father     Social History Social History   Tobacco Use   Smoking status: Never   Smokeless tobacco: Never  Substance Use Topics   Alcohol use: Yes    Comment: few drink on the weekneds   Drug use: Not Currently     Allergies   Patient has no known allergies.   Review of Systems Review of Systems  Constitutional:  Negative for chills and fever.  Musculoskeletal:  Positive for arthralgias and joint swelling.  Skin:  Positive for color change and wound.  Neurological:  Negative for weakness and numbness.     Physical Exam Triage Vital  Signs ED Triage Vitals  Encounter Vitals Group     BP --      Systolic BP Percentile --      Diastolic BP Percentile --      Pulse Rate 05/02/23 1105 70     Resp 05/02/23 1105 18     Temp 05/02/23 1105 97.8 F (36.6 C)     Temp src --      SpO2 05/02/23 1105 95 %     Weight --      Height --      Head Circumference --      Peak Flow --      Pain Score 05/02/23 1119 6     Pain Loc --      Pain Education --      Exclude from Growth Chart --    No data found.  Updated Vital Signs BP (!) 160/91   Pulse 70   Temp 97.8 F (36.6 C)   Resp 18   SpO2 95%   Visual Acuity Right Eye Distance:   Left Eye Distance:   Bilateral Distance:    Right Eye Near:   Left Eye Near:    Bilateral Near:     Physical  Exam Constitutional:      General: He is not in acute distress. HENT:     Mouth/Throat:     Mouth: Mucous membranes are moist.  Cardiovascular:     Rate and Rhythm: Normal rate and regular rhythm.  Pulmonary:     Effort: Pulmonary effort is normal. No respiratory distress.  Musculoskeletal:        General: Swelling and tenderness present. No deformity. Normal range of motion.       Hands:  Skin:    General: Skin is warm and dry.     Findings: Bruising and lesion present.  Neurological:     General: No focal deficit present.     Mental Status: He is alert and oriented to person, place, and time.     Sensory: No sensory deficit.     Motor: No weakness.  Psychiatric:        Mood and Affect: Mood normal.        Behavior: Behavior normal.      UC Treatments / Results  Labs (all labs ordered are listed, but only abnormal results are displayed) Labs Reviewed - No data to display  EKG   Radiology DG Finger Index Right  Result Date: 05/02/2023 CLINICAL DATA:  Pain, finger injured by machinery EXAM: RIGHT INDEX FINGER 2+V COMPARISON:  None Available. FINDINGS: Minimally displaced fractures of the tuft of the index digit. There is no evidence of arthropathy or other focal bone abnormality. No radiopaque foreign body. IMPRESSION: Minimally displaced fractures of the tuft of the index digit. Electronically Signed   By: Jearld Lesch M.D.   On: 05/02/2023 12:14    Procedures Procedures (including critical care time)  Medications Ordered in UC Medications  Tdap (BOOSTRIX) injection 0.5 mL (0.5 mLs Intramuscular Given 05/02/23 1137)    Initial Impression / Assessment and Plan / UC Course  I have reviewed the triage vital signs and the nursing notes.  Pertinent labs & imaging results that were available during my care of the patient were reviewed by me and considered in my medical decision making (see chart for details).    Minimally displaced fracture of distal right index  finger, fingernail avulsion of right index finger, pain of right index finger.  Elevated blood pressure  reading with hypertension.    Xray shows minimally displaced fractures of tuft of right index finger.   Treating with Keflex.  Treating with finger splint, buddy taping finger, rest, elevation, ice.  Wound care instructions and signs of infection discussed.  Education provided on finger fracture.  Tetanus updated.  Instructed patient to follow-up with an orthopedic hand specialist.  Contact information for Ortho provided.    Also discussed that his blood pressure is elevated and needs to be rechecked by his PCP in 2 to 4 weeks.  Education provided on managing hypertension.   He agrees to plan of care.  Final Clinical Impressions(s) / UC Diagnoses   Final diagnoses:  Pain of finger of right hand  Avulsion of fingernail, initial encounter  Elevated blood pressure reading in office with diagnosis of hypertension  Open displaced fracture of distal phalanx of right index finger, initial encounter     Discharge Instructions      Take the antibiotic as directed.  Keep your wound clean and dry.  Wash it gently twice a day with soap and water.  Apply an antibiotic cream twice a day.  Wear the finger splint.   Follow up with an orthopedic hand specialist.   Follow up right away if you see signs of infection, such as increased pain, redness, pus-like drainage, warmth, fever, chills, or other concerning symptoms.    Your blood pressure is elevated today at 160/91.  Please have this rechecked by your primary care provider in 2-4 weeks.           ED Prescriptions     Medication Sig Dispense Auth. Provider   cephALEXin (KEFLEX) 500 MG capsule Take 1 capsule (500 mg total) by mouth 4 (four) times daily. 28 capsule Mickie Bail, NP      PDMP not reviewed this encounter.   Mickie Bail, NP 05/02/23 [phone]
